# Patient Record
Sex: Female | Born: 1995 | Race: White | Hispanic: No | Marital: Single | Smoking: Current every day smoker
Health system: Southern US, Community
[De-identification: ages and names within clinical notes are randomized; demographics above are authoritative.]

## PROBLEM LIST (undated history)

## (undated) DIAGNOSIS — Z789 Other specified health status: Secondary | ICD-10-CM

## (undated) HISTORY — PX: NO PAST SURGERIES: SHX2092

---

## 2016-11-06 ENCOUNTER — Other Ambulatory Visit (HOSPITAL_COMMUNITY): Payer: Self-pay | Admitting: Family Medicine

## 2016-11-06 DIAGNOSIS — F1911 Other psychoactive substance abuse, in remission: Secondary | ICD-10-CM

## 2016-11-06 DIAGNOSIS — Z3689 Encounter for other specified antenatal screening: Secondary | ICD-10-CM

## 2016-11-06 DIAGNOSIS — Z3A32 32 weeks gestation of pregnancy: Secondary | ICD-10-CM

## 2016-11-07 ENCOUNTER — Encounter (HOSPITAL_COMMUNITY): Payer: Self-pay | Admitting: *Deleted

## 2016-11-08 ENCOUNTER — Ambulatory Visit (HOSPITAL_COMMUNITY)
Admission: RE | Admit: 2016-11-08 | Discharge: 2016-11-08 | Disposition: A | Discharge status: Home / Self Care | Payer: Self-pay | Source: Ambulatory Visit | Attending: Family Medicine | Admitting: Family Medicine

## 2016-11-13 ENCOUNTER — Ambulatory Visit (HOSPITAL_COMMUNITY)
Admission: RE | Admit: 2016-11-13 | Discharge: 2016-11-13 | Disposition: A | Discharge status: Home / Self Care | Payer: Medicaid Other | Source: Ambulatory Visit | Attending: Family Medicine | Admitting: Family Medicine

## 2016-11-13 ENCOUNTER — Other Ambulatory Visit (HOSPITAL_COMMUNITY): Payer: Self-pay | Admitting: *Deleted

## 2016-11-13 ENCOUNTER — Encounter (HOSPITAL_COMMUNITY): Payer: Self-pay

## 2016-11-13 DIAGNOSIS — O99333 Smoking (tobacco) complicating pregnancy, third trimester: Secondary | ICD-10-CM | POA: Diagnosis not present

## 2016-11-13 DIAGNOSIS — Z3689 Encounter for other specified antenatal screening: Secondary | ICD-10-CM | POA: Insufficient documentation

## 2016-11-13 DIAGNOSIS — Z3A32 32 weeks gestation of pregnancy: Secondary | ICD-10-CM | POA: Insufficient documentation

## 2016-11-13 DIAGNOSIS — O99323 Drug use complicating pregnancy, third trimester: Secondary | ICD-10-CM | POA: Diagnosis not present

## 2016-11-13 DIAGNOSIS — R6252 Short stature (child): Secondary | ICD-10-CM

## 2016-11-13 DIAGNOSIS — F1911 Other psychoactive substance abuse, in remission: Secondary | ICD-10-CM

## 2016-11-13 HISTORY — DX: Other specified health status: Z78.9

## 2016-11-13 NOTE — Addendum Note (Signed)
Encounter addended by: Melynda Kellerevin R Lia Vigilante, RT on: 11/13/2016  1:30 PM<BR>    Actions taken: Imaging Exam ended

## 2016-12-04 ENCOUNTER — Encounter (HOSPITAL_COMMUNITY): Payer: Self-pay

## 2016-12-04 ENCOUNTER — Ambulatory Visit (HOSPITAL_COMMUNITY)
Admission: RE | Admit: 2016-12-04 | Discharge: 2016-12-04 | Disposition: A | Discharge status: Home / Self Care | Payer: Medicaid Other | Source: Ambulatory Visit | Attending: Family Medicine | Admitting: Family Medicine

## 2017-01-03 ENCOUNTER — Encounter (HOSPITAL_COMMUNITY): Payer: Self-pay

## 2017-01-03 ENCOUNTER — Ambulatory Visit (HOSPITAL_COMMUNITY): Payer: Medicaid Other

## 2017-07-01 ENCOUNTER — Encounter (HOSPITAL_COMMUNITY): Payer: Self-pay

## 2018-08-08 IMAGING — US US MFM OB DETAIL+14 WK
1 series · 12 of 28 positions shown · non-contrast
Comparison: none

[Series 1: us mfm ob detail+14 wk · 95 acquisitions, 12 frames shown]
[im 4/95]
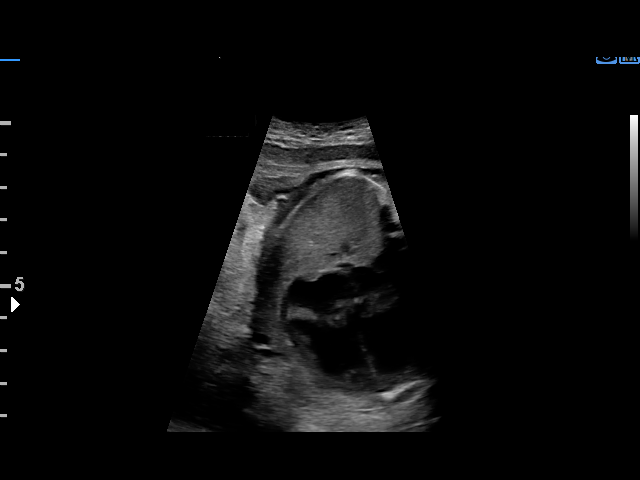
[im 11/95]
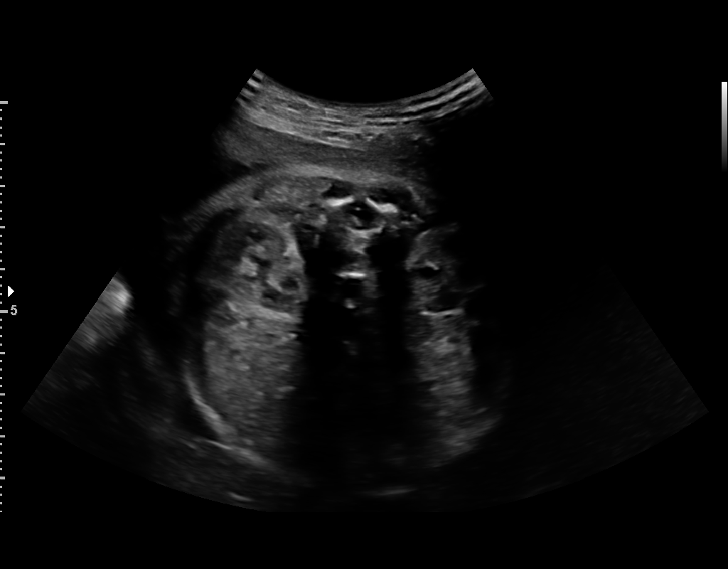
[im 18/95]
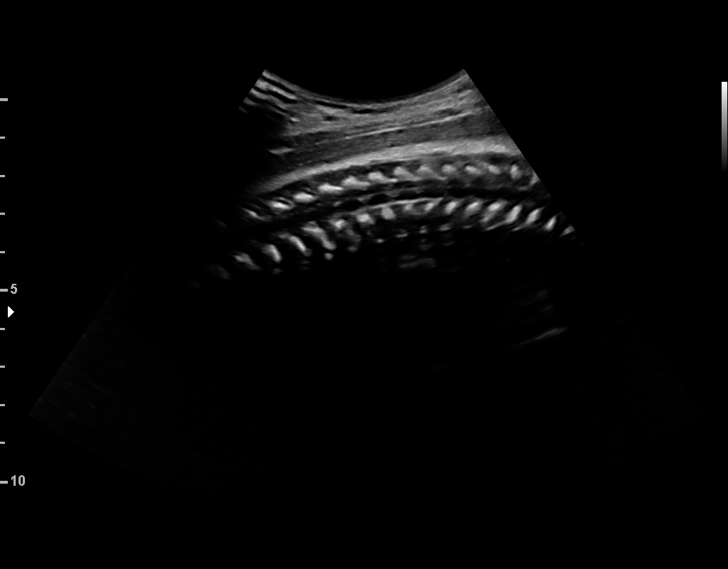
[im 28/95]
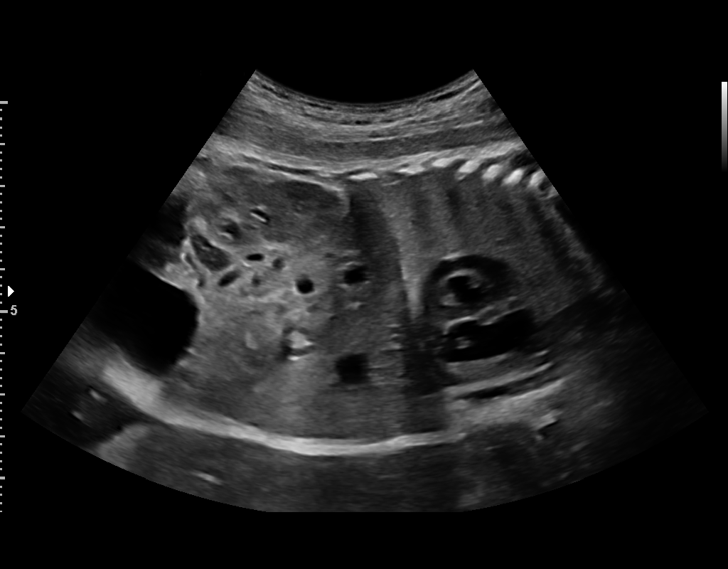
[im 35/95]
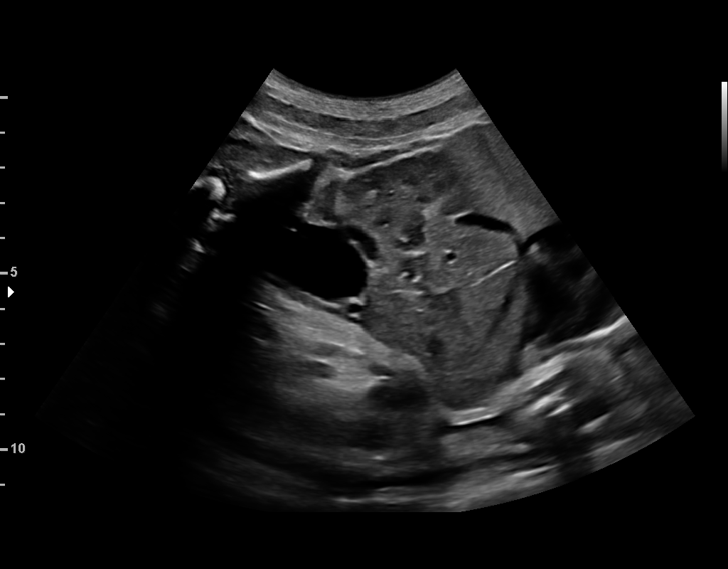
[im 42/95]
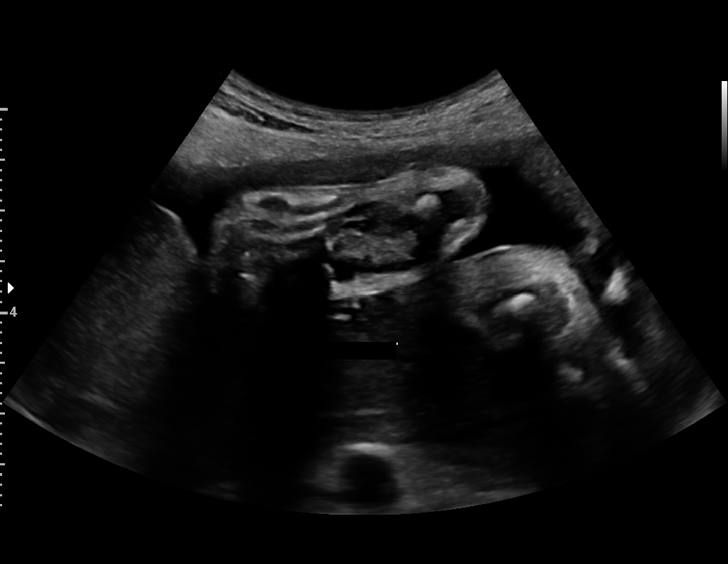
[im 53/95]
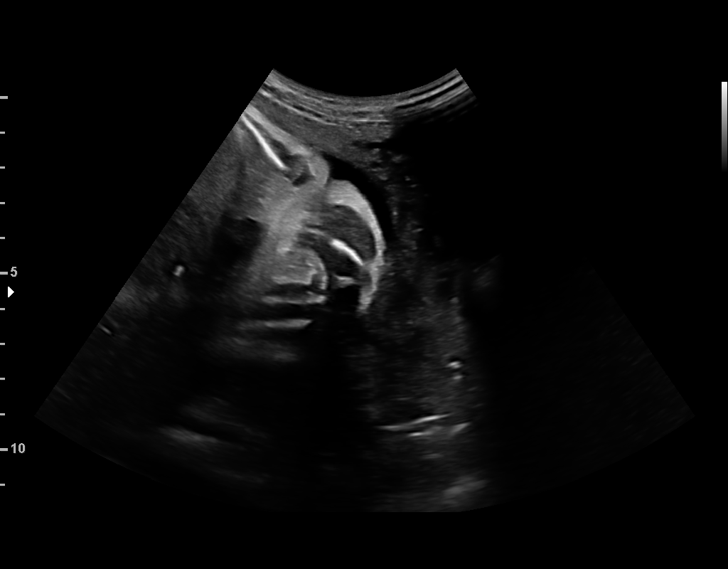
[im 60/95]
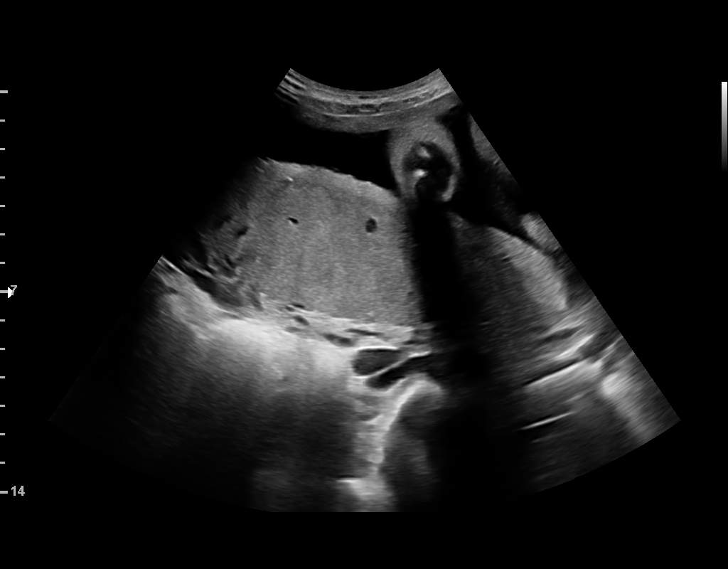
[im 67/95]
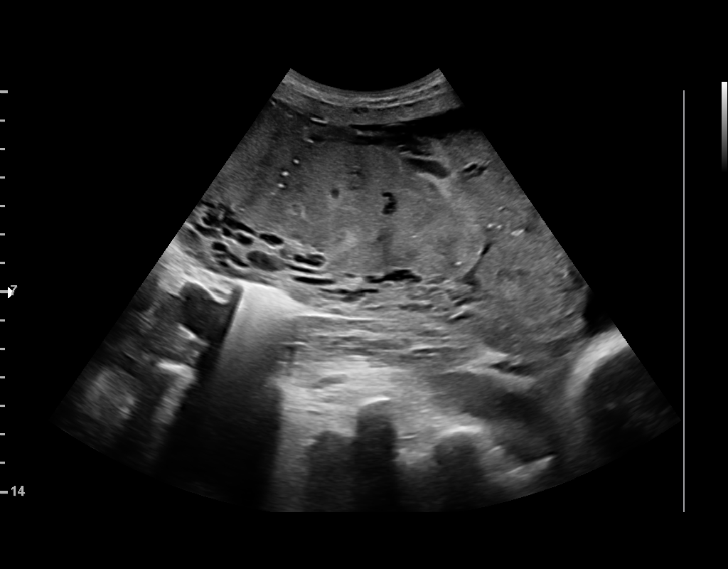
[im 77/95]
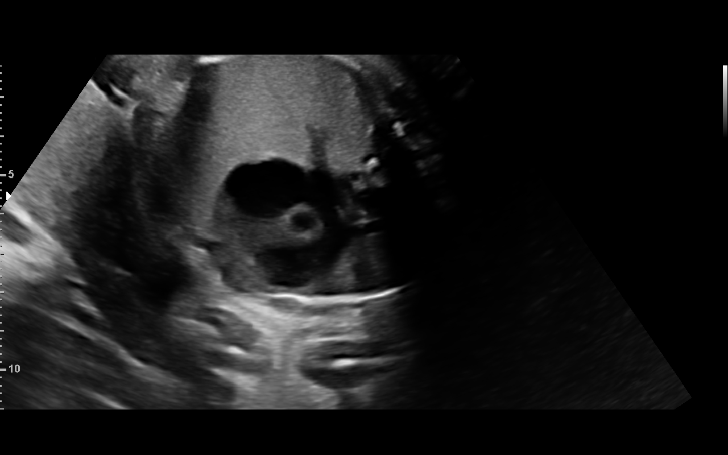
[im 84/95]
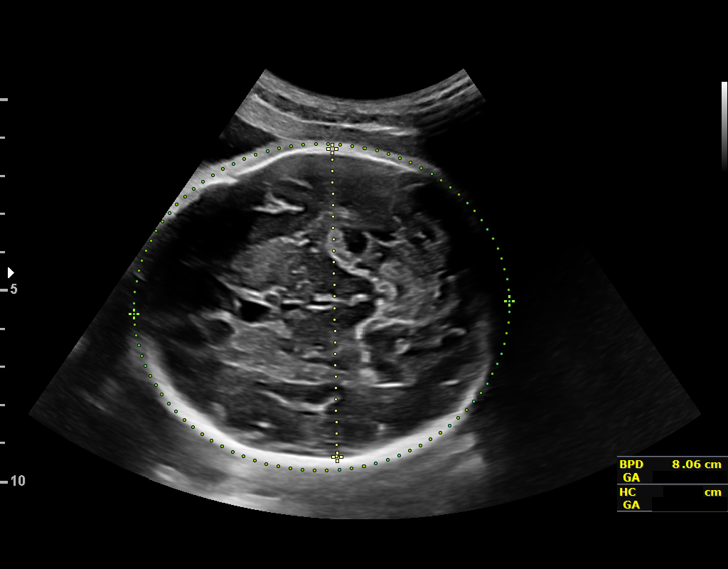
[im 91/95]
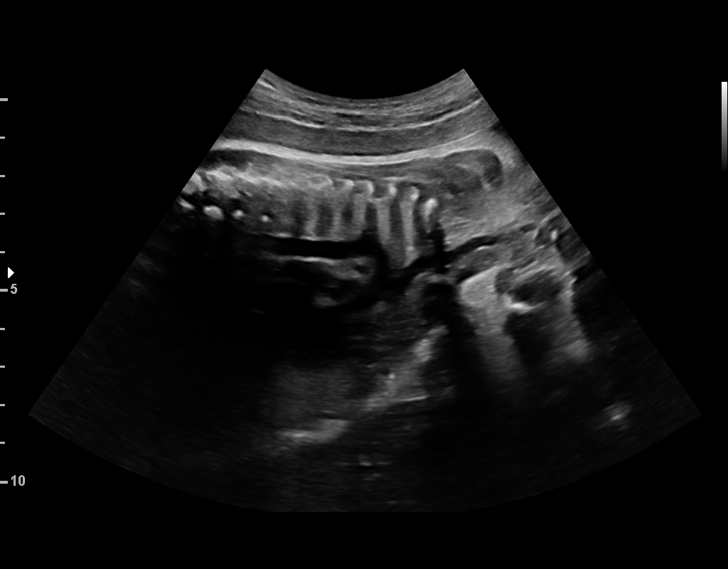

[12 of 28 positions shown; findings below may reference images not displayed]

1  GARGI TIGER             [id number]      [id number]     [id number]
Indications

32 weeks gestation of pregnancy
Suboxone use (quit 3 days ago; hx of heroin
abuse)
Smoking complicating pregnancy, third
trimester
Encounter for fetal anatomic survey
OB History

Blood Type:            Height:         Weight (lb):  125       BMI:
Gravidity:    3         Term:   1        Prem:   0        SAB:   1
TOP:          0       Ectopic:  0        Living: 1
Fetal Evaluation

Num Of Fetuses:     1
Fetal Heart         154
Rate(bpm):
Cardiac Activity:   Observed
Presentation:       Cephalic
Placenta:           Posterior, above cervical os
P. Cord Insertion:  Visualized

Amniotic Fluid
AFI FV:      Subjectively within normal limits

AFI Sum(cm)     %Tile       Largest Pocket(cm)
14.87           52

RUQ(cm)       RLQ(cm)       LUQ(cm)        LLQ(cm)
2.58
Biometry

BPD:      80.4  mm     G. Age:  32w 2d         30  %    CI:        77.08   %    70 - 86
FL/HC:      19.0   %    19.9 -
HC:       290   mm     G. Age:  31w 6d          6  %    HC/AC:      1.05        0.96 -
AC:      276.9  mm     G. Age:  31w 6d         23  %    FL/BPD:     68.7   %    71 - 87
FL:       55.2  mm     G. Age:  29w 1d        < 3  %    FL/AC:      19.9   %    20 - 24
HUM:      51.6  mm     G. Age:  30w 1d        < 5  %

Est. FW:    4314  gm    3 lb 11 oz      25  %
Gestational Age

U/S Today:     31w 2d                                        EDD:   01/13/17
Best:          32w 5d     Det. By:  Early Ultrasound         EDD:   01/03/17
(06/27/16)
Anatomy

Cranium:               Appears normal         Aortic Arch:            Appears normal
Cavum:                 Appears normal         Ductal Arch:            Not well visualized
Ventricles:            Appears normal         Diaphragm:              Appears normal
Choroid Plexus:        Appears normal         Stomach:                Appears normal, left
sided
Cerebellum:            Appears normal         Abdomen:                Appears normal
Posterior Fossa:       Appears normal         Abdominal Wall:         Not well visualized
Nuchal Fold:           Not applicable (>20    Cord Vessels:           Appears normal (3
wks GA)                                        vessel cord)
Face:                  Not well visualized    Kidneys:                Appear normal
Lips:                  Appears normal         Bladder:                Appears normal
Thoracic:              Appears normal         Spine:                  Limited views
appear normal
Heart:                 Appears normal         Upper Extremities:      Appears normal
(4CH, axis, and situs
RVOT:                  Appears normal         Lower Extremities:      Appears normal
LVOT:                  Appears normal

Other:  Fetus appears to be a female. Heels previously seen. Technically
difficult due to advanced GA and fetal position.
Cervix Uterus Adnexa

Cervix
Not visualized (advanced GA >26wks)

Uterus
No abnormality visualized.

Left Ovary
Not visualized.

Right Ovary
Not visualized.
Impression

Singleton intrauterine pregnancy at 32+5 weeks with history
of opiate addiction. now off suboxone x 3 days
Review of the anatomy shows no sonographic markers for
aneuploidy or structural anomalies
Amniotic fluid volume is normal
Estimated fetal weight is 1681g which is growth in the 25th
percentile
Recommendations

I have asked her to return for a repeat scan in 3 weeks. I let
her know that she should try to find a support group to avoid
opiate relapse. Unsupported abstinence has a very high
relapse rate
